# Patient Record
Sex: Female | Born: 1990 | Hispanic: Yes | Marital: Married | Smoking: Never smoker
Health system: Southern US, Community
[De-identification: ages and names within clinical notes are randomized; demographics above are authoritative.]

## PROBLEM LIST (undated history)

## (undated) DIAGNOSIS — D649 Anemia, unspecified: Secondary | ICD-10-CM

## (undated) HISTORY — PX: NO PAST SURGERIES: SHX2092

---

## 2015-11-05 ENCOUNTER — Other Ambulatory Visit: Payer: Self-pay

## 2015-11-07 ENCOUNTER — Encounter (HOSPITAL_COMMUNITY): Payer: Self-pay

## 2016-04-04 ENCOUNTER — Encounter (HOSPITAL_COMMUNITY): Payer: Self-pay | Admitting: Specialist

## 2016-04-04 ENCOUNTER — Other Ambulatory Visit (HOSPITAL_COMMUNITY): Payer: Self-pay | Admitting: Specialist

## 2016-04-04 DIAGNOSIS — R9389 Abnormal findings on diagnostic imaging of other specified body structures: Secondary | ICD-10-CM

## 2016-04-04 DIAGNOSIS — Z3689 Encounter for other specified antenatal screening: Secondary | ICD-10-CM

## 2016-04-04 DIAGNOSIS — Z3A31 31 weeks gestation of pregnancy: Secondary | ICD-10-CM

## 2016-04-15 ENCOUNTER — Ambulatory Visit (HOSPITAL_COMMUNITY)
Admission: RE | Admit: 2016-04-15 | Discharge: 2016-04-15 | Disposition: A | Discharge status: Home / Self Care | Payer: Medicaid Other | Source: Ambulatory Visit | Attending: Specialist | Admitting: Specialist

## 2016-04-15 ENCOUNTER — Encounter (HOSPITAL_COMMUNITY): Payer: Self-pay

## 2016-04-15 ENCOUNTER — Other Ambulatory Visit (HOSPITAL_COMMUNITY): Payer: Self-pay | Admitting: *Deleted

## 2016-04-15 DIAGNOSIS — O0933 Supervision of pregnancy with insufficient antenatal care, third trimester: Secondary | ICD-10-CM | POA: Diagnosis not present

## 2016-04-15 DIAGNOSIS — Z3A31 31 weeks gestation of pregnancy: Secondary | ICD-10-CM | POA: Diagnosis not present

## 2016-04-15 DIAGNOSIS — Z3689 Encounter for other specified antenatal screening: Secondary | ICD-10-CM

## 2016-04-15 DIAGNOSIS — R9389 Abnormal findings on diagnostic imaging of other specified body structures: Secondary | ICD-10-CM

## 2016-04-15 DIAGNOSIS — O34593 Maternal care for other abnormalities of gravid uterus, third trimester: Secondary | ICD-10-CM | POA: Insufficient documentation

## 2016-04-15 DIAGNOSIS — Z820 Family history of epilepsy and other diseases of the nervous system: Secondary | ICD-10-CM

## 2016-04-15 DIAGNOSIS — Z8279 Family history of other congenital malformations, deformations and chromosomal abnormalities: Secondary | ICD-10-CM

## 2016-04-15 HISTORY — DX: Anemia, unspecified: D64.9

## 2016-04-23 ENCOUNTER — Ambulatory Visit (HOSPITAL_COMMUNITY)
Admission: RE | Admit: 2016-04-23 | Discharge: 2016-04-23 | Disposition: A | Discharge status: Home / Self Care | Payer: Medicaid Other | Source: Ambulatory Visit | Attending: Specialist | Admitting: Specialist

## 2016-04-23 ENCOUNTER — Encounter (HOSPITAL_COMMUNITY): Payer: Self-pay

## 2016-04-23 DIAGNOSIS — Z8279 Family history of other congenital malformations, deformations and chromosomal abnormalities: Secondary | ICD-10-CM

## 2016-04-23 DIAGNOSIS — Z820 Family history of epilepsy and other diseases of the nervous system: Secondary | ICD-10-CM | POA: Diagnosis not present

## 2016-12-08 ENCOUNTER — Encounter (HOSPITAL_COMMUNITY): Payer: Self-pay

## 2017-10-20 IMAGING — US US MFM OB DETAIL+14 WK
1 series · 14 of 28 positions shown · non-contrast
Comparison: none

[Series 1: us mfm ob detail+14 wk · 14 of 80 slices shown]
[im 3/80]
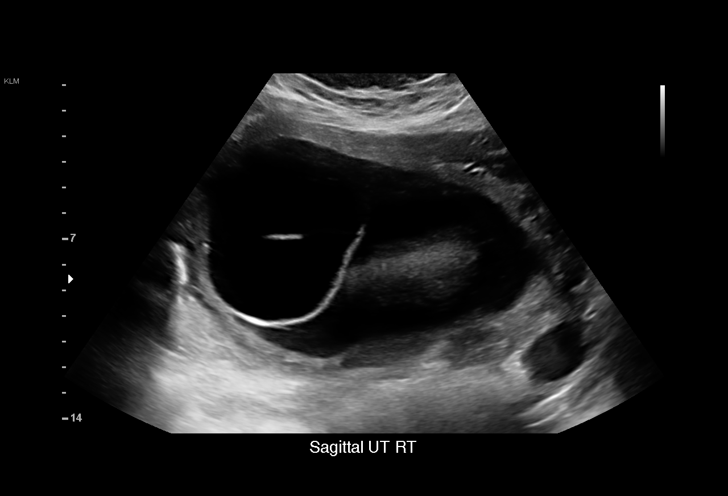
[im 9/80]
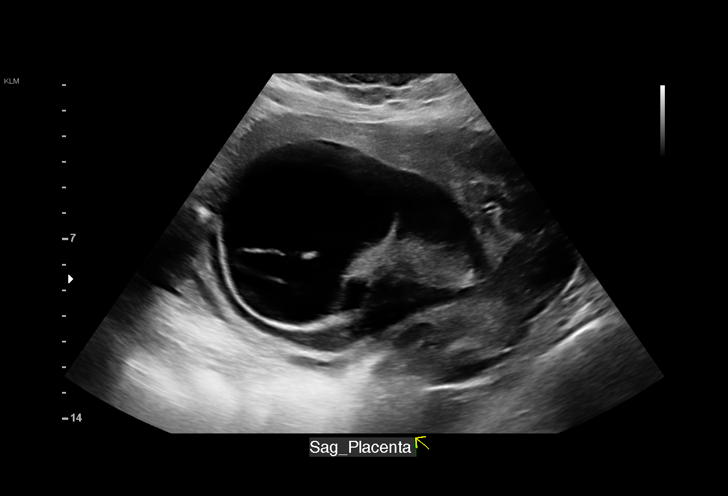
[im 15/80]
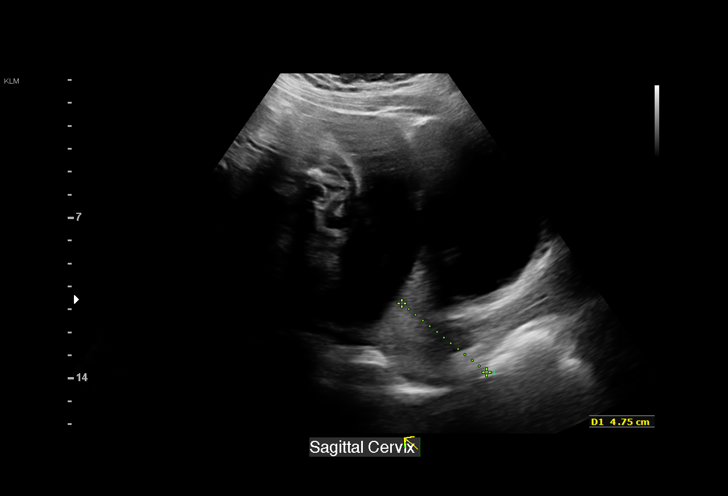
[im 21/80]
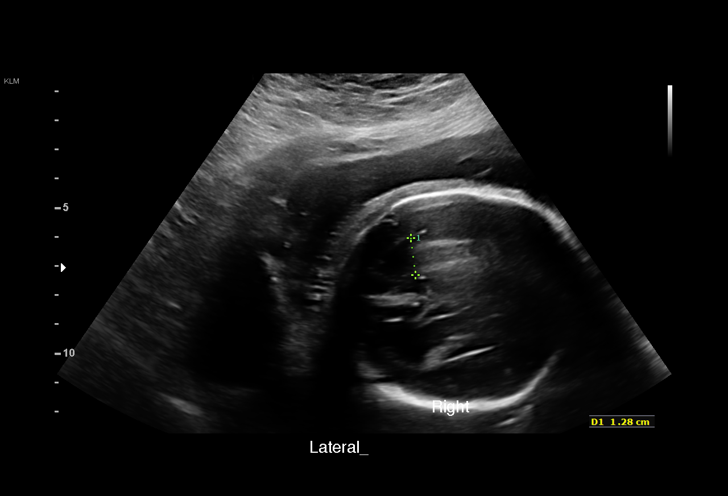
[im 27/80]
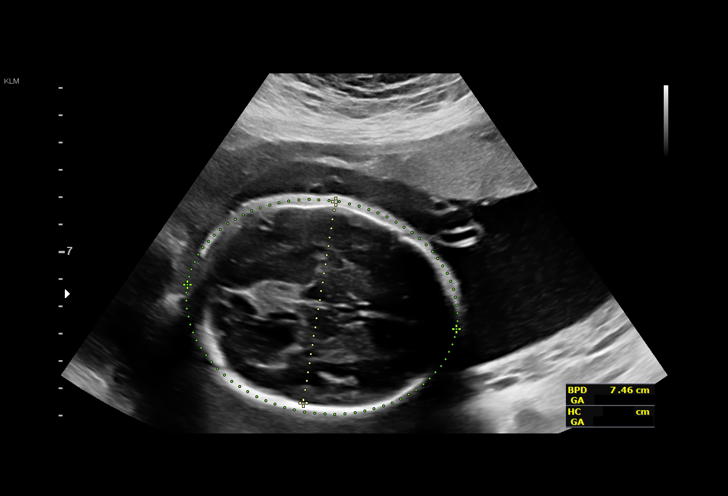
[im 33/80]
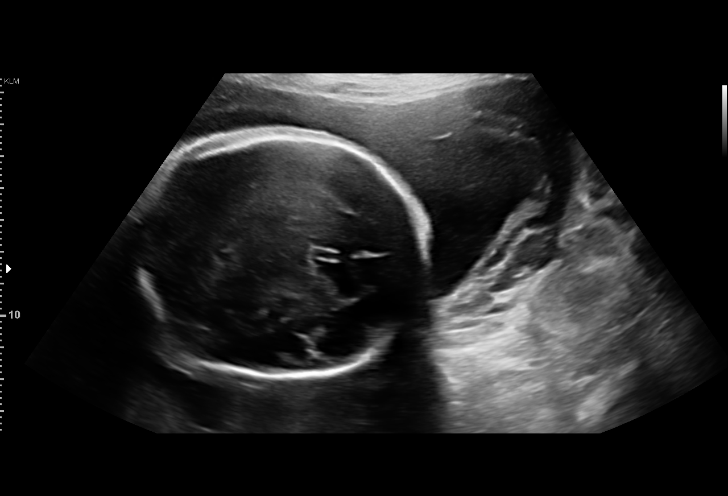
[im 39/80]
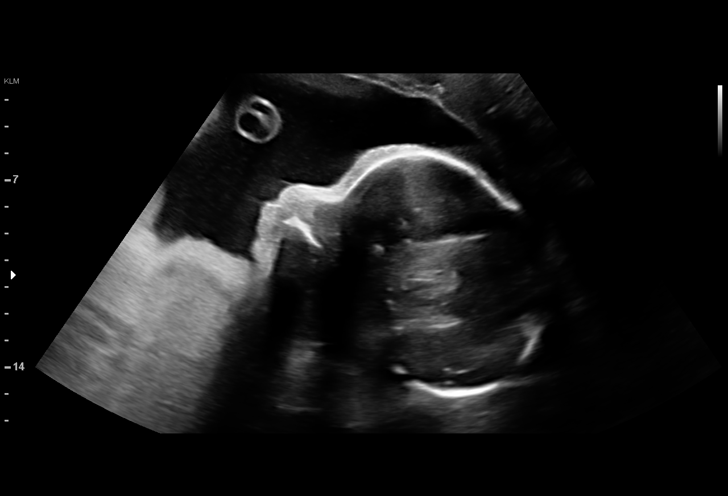
[im 44/80]
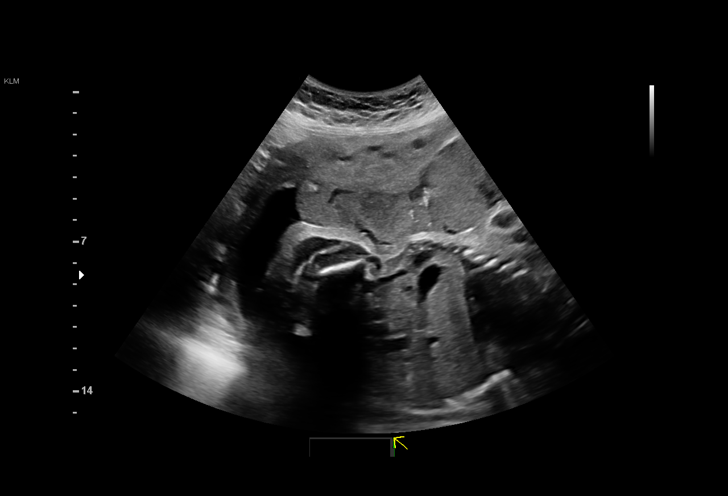
[im 50/80]
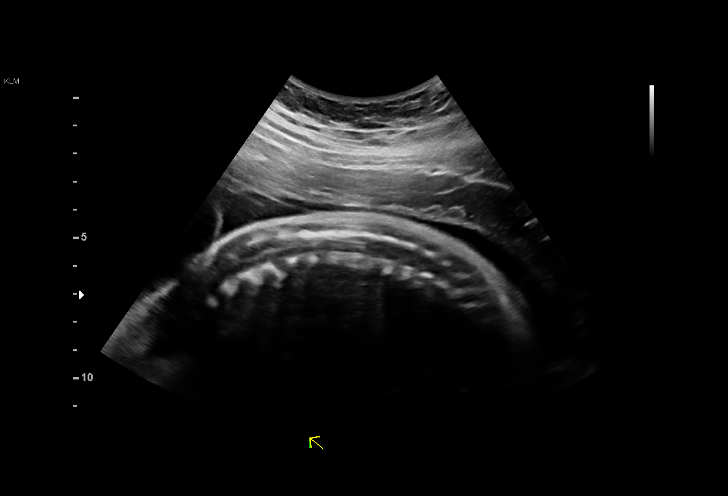
[im 56/80]
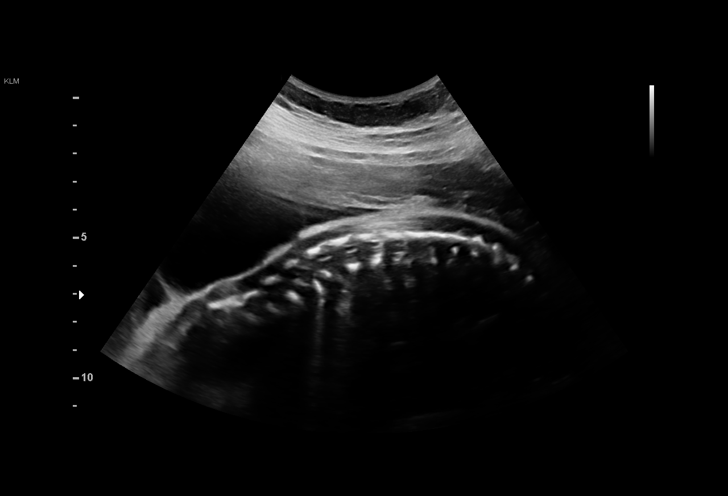
[im 62/80]
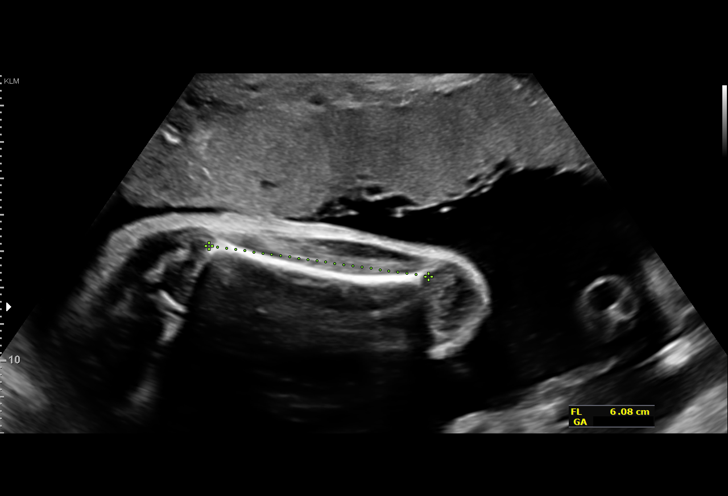
[im 68/80]
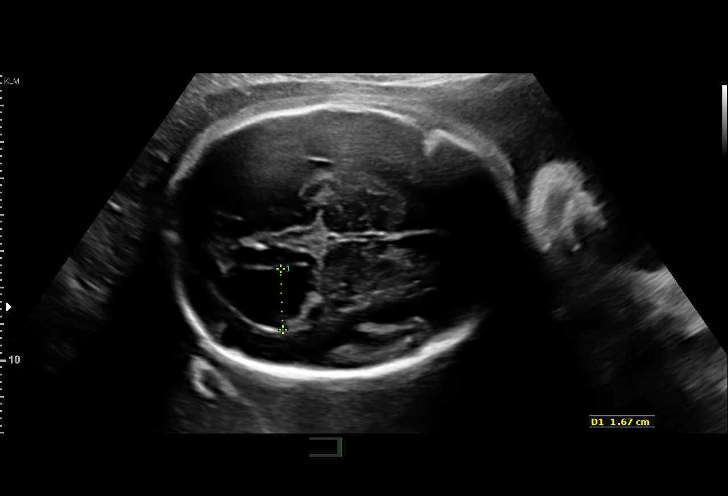
[im 74/80]
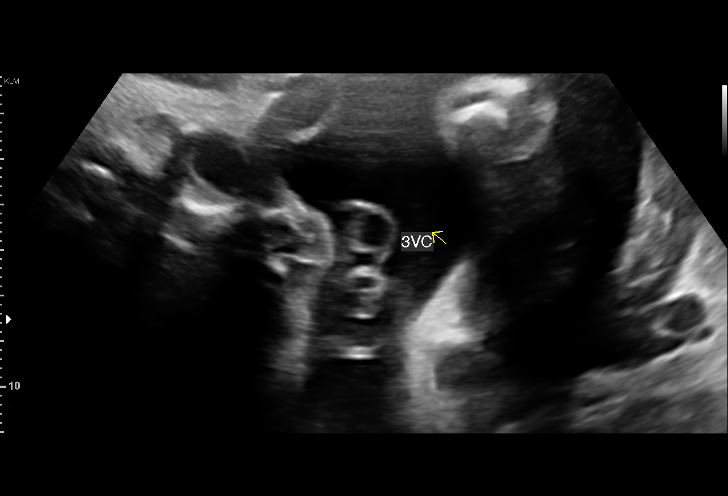
[im 80/80]
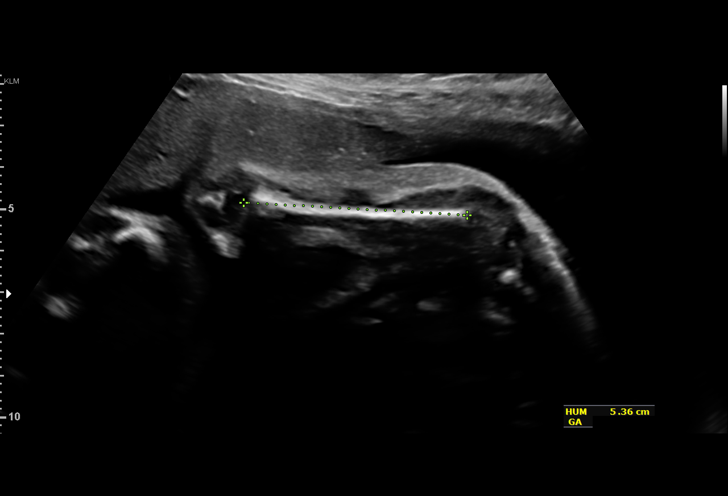

[14 of 28 positions shown; findings below may reference images not displayed]

[HOSPITAL][HOSPITAL]

1  BABARINSA KIO               [id number]      [id number]     [id number]
Indications

31 weeks gestation of pregnancy
Insufficient Prenatal Care
Uterine abnormality during pregnancy
OB History

Gravidity:    3         Term:   1        Prem:   0        SAB:   1
TOP:          0       Ectopic:  0        Living: 1
Fetal Evaluation

Num Of Fetuses:     1
Fetal Heart         154
Rate(bpm):
Cardiac Activity:   Observed
Presentation:       Cephalic
Placenta:           Anterior, above cervical os
P. Cord Insertion:  Visualized

Amniotic Fluid
AFI FV:      Subjectively within normal limits
Biometry

BPD:      74.4  mm     G. Age:  29w 6d          5  %    CI:        72.33   %    70 - 86
FL/HC:      21.4   %    19.1 -
HC:      278.3  mm     G. Age:  30w 3d        < 3  %    HC/AC:      0.91        0.96 -
AC:      304.9  mm     G. Age:  34w 3d       > 97  %    FL/BPD:     80.0   %    71 - 87
FL:       59.5  mm     G. Age:  31w 0d         23  %    FL/AC:      19.5   %    20 - 24
HUM:      53.3  mm     G. Age:  31w 0d         41  %

Est. FW:    1995  gm      4 lb 7 oz     75  %
Gestational Age

LMP:           31w 4d        Date:  09/07/15                 EDD:   06/13/16
U/S Today:     31w 3d                                        EDD:   06/14/16
Best:          31w 4d     Det. By:  LMP  (09/07/15)          EDD:   06/13/16
Anatomy

Face:                  Orbits nl; profile not Abdomen:                Appears normal
well visualized
Heart:                 Appears normal         Abdominal Wall:         Not well visualized
(4CH, axis, and situs
RVOT:                  Not well visualized    Cord Vessels:           Appears normal (3
vessel cord)
LVOT:                  Not well visualized    Kidneys:                Appear normal
Aortic Arch:           Not well visualized    Bladder:                Appears normal
Ductal Arch:           Not well visualized    Upper Extremities:      Visualized
Diaphragm:             Appears normal         Lower Extremities:      Appears normal
Stomach:               Appears normal, left
sided

Other:  Fetus appears to be a female.
Cervix Uterus Adnexa

Cervix
Length:              4  cm.
Normal appearance by transabdominal scan.
Impression

Singleton intrauterine pregnancy at 31+4 weeks with uterine
cyst
Review of the anatomy shows a Large cystic structure
measuring 33x63x23mm with a single bisecting septum. It
appears to arise from the lumbosacral regieon of the fetus.
There is mild to moderate bilateral ventriculomegaly
measuring 15mm on the right and 13mm on the left.
No other malformations are seen. The cavum septum
pellucidum is not seen on todays scan. However, evaluations
should be considered suboptimal secondary to the  late EGA
Amniotic fluid volume is normal
Estimated fetal weight is 2013g which is growth in the 75th
percentile
Recommendations

I am suspicious for a lumbosacral meningomyelocele, but the
intracranial picture is not fully in line with that, in that the
posterior fossa is completely normal. further, it is difficult to
display vertebral body splaying as one would expect in a cyst
that size.
I have gone ahead and ordered a Fetal MRI at [facility] to
be done ASAP. We will see her againnext week and will
hopefully have a definitive diagnosis at that time, and make
plans for future management

## 2019-02-18 ENCOUNTER — Encounter: Payer: Self-pay | Admitting: Family Medicine

## 2021-11-17 ENCOUNTER — Ambulatory Visit (HOSPITAL_COMMUNITY)
Admission: EM | Admit: 2021-11-17 | Discharge: 2021-11-17 | Payer: Medicaid Other | Attending: Physician Assistant | Admitting: Physician Assistant

## 2021-11-17 ENCOUNTER — Other Ambulatory Visit: Payer: Self-pay

## 2021-11-17 ENCOUNTER — Emergency Department (HOSPITAL_COMMUNITY)
Admission: EM | Admit: 2021-11-17 | Discharge: 2021-11-18 | Discharge status: Against Medical Advice | Payer: Medicaid Other | Attending: Emergency Medicine | Admitting: Emergency Medicine

## 2021-11-17 ENCOUNTER — Encounter (HOSPITAL_COMMUNITY): Payer: Self-pay | Admitting: Physician Assistant

## 2021-11-17 ENCOUNTER — Encounter (HOSPITAL_COMMUNITY): Payer: Self-pay | Admitting: Emergency Medicine

## 2021-11-17 DIAGNOSIS — R1013 Epigastric pain: Secondary | ICD-10-CM | POA: Diagnosis not present

## 2021-11-17 DIAGNOSIS — R1011 Right upper quadrant pain: Secondary | ICD-10-CM | POA: Diagnosis present

## 2021-11-17 DIAGNOSIS — R112 Nausea with vomiting, unspecified: Secondary | ICD-10-CM | POA: Insufficient documentation

## 2021-11-17 DIAGNOSIS — Z5321 Procedure and treatment not carried out due to patient leaving prior to being seen by health care provider: Secondary | ICD-10-CM | POA: Insufficient documentation

## 2021-11-17 LAB — COMPREHENSIVE METABOLIC PANEL
ALT: 67 U/L — ABNORMAL HIGH (ref 0–44)
AST: 203 U/L — ABNORMAL HIGH (ref 15–41)
Albumin: 4.1 g/dL (ref 3.5–5.0)
Alkaline Phosphatase: 99 U/L (ref 38–126)
Anion gap: 8 (ref 5–15)
BUN: 12 mg/dL (ref 6–20)
CO2: 25 mmol/L (ref 22–32)
Calcium: 9.3 mg/dL (ref 8.9–10.3)
Chloride: 105 mmol/L (ref 98–111)
Creatinine, Ser: 0.65 mg/dL (ref 0.44–1.00)
GFR, Estimated: >60 mL/min (ref >60)
Glucose, Bld: 127 mg/dL — ABNORMAL HIGH (ref 70–99)
Potassium: 3.5 mmol/L (ref 3.5–5.1)
Sodium: 138 mmol/L (ref 135–145)
Total Bilirubin: 1.7 mg/dL — ABNORMAL HIGH (ref 0.3–1.2)
Total Protein: 7 g/dL (ref 6.5–8.1)

## 2021-11-17 LAB — CBC
HCT: 37.7 % (ref 36.0–46.0)
Hemoglobin: 12.3 g/dL (ref 12.0–15.0)
MCH: 26.4 pg (ref 26.0–34.0)
MCHC: 32.6 g/dL (ref 30.0–36.0)
MCV: 80.9 fL (ref 80.0–100.0)
Platelets: 312 10*3/uL (ref 150–400)
RBC: 4.66 MIL/uL (ref 3.87–5.11)
RDW: 14.7 % (ref 11.5–15.5)
WBC: 12.5 10*3/uL — ABNORMAL HIGH (ref 4.0–10.5)
nRBC: 0 % (ref 0.0–0.2)

## 2021-11-17 LAB — I-STAT BETA HCG BLOOD, ED (MC, WL, AP ONLY): I-stat hCG, quantitative: <5 m[IU]/mL (ref <5)

## 2021-11-17 LAB — POCT URINALYSIS DIPSTICK, ED / UC
Glucose, UA: NEGATIVE mg/dL
Leukocytes,Ua: NEGATIVE
Nitrite: NEGATIVE
Protein, ur: 30 mg/dL — AB
Specific Gravity, Urine: 1.025 (ref 1.005–1.030)
Urobilinogen, UA: 1 mg/dL (ref 0.0–1.0)
pH: 6.5 (ref 5.0–8.0)

## 2021-11-17 LAB — LIPASE, BLOOD: Lipase: 35 U/L (ref 11–51)

## 2021-11-17 LAB — POC URINE PREG, ED: Preg Test, Ur: NEGATIVE

## 2021-11-17 MED ORDER — ONDANSETRON 4 MG PO TBDP: 4.0000 mg | ORAL_TABLET | Freq: Once | ORAL | Status: DC | PRN

## 2021-11-17 MED ORDER — ONDANSETRON 4 MG PO TBDP: 8.0000 mg | ORAL_TABLET | Freq: Once | ORAL | Status: DC

## 2021-11-17 MED ORDER — HYDROCODONE-ACETAMINOPHEN 5-325 MG PO TABS: 1.0000 | ORAL_TABLET | Freq: Once | ORAL | Status: DC

## 2021-11-17 NOTE — ED Provider Notes (Signed)
[facility]    CSN: [id number] Arrival date & time: 11/17/21  1259      History   Chief Complaint Chief Complaint  Patient presents with   Abdominal Pain   Back Pain   Emesis    HPI Danielle Orr is a 31 y.o. female.   Patient here today for evaluation of epigastric pain and vomiting that started last night about 11 PM.  Pain radiates to her center back.  She reports that since this time she has had 2 episodes of vomiting.  She notes she is constipated and does report a very small bowel movement earlier today.  She does not report any blood in her stool or dark tarry stools.  She states she has had similar epigastric pain in the past but typically it was very short-lived and would resolve without any treatment and would clear for significant amount of time.  She notes that since 11 PM her pain has remained constant although does wax and wane between a 3 out of 10 pain level and a 10 out of 10 pain level.  She states when her pain is at its worst she does feel diaphoretic and as if she will pass out.  The history is provided by the patient.  Abdominal Pain Associated symptoms: constipation, nausea and vomiting   Associated symptoms: no chills, no diarrhea, no fever and no shortness of breath   Back Pain Associated symptoms: abdominal pain   Associated symptoms: no fever   Emesis Associated symptoms: abdominal pain   Associated symptoms: no chills, no diarrhea and no fever     Past Medical History:  Diagnosis Date   Anemia     There are no problems to display for this patient.   Past Surgical History:  Procedure Laterality Date   NO PAST SURGERIES      OB History     Gravida  3   Para  1   Term  1   Preterm      AB  1   Living  1      SAB  1   IAB      Ectopic      Multiple      Live Births               Home Medications    Prior to Admission medications   Medication Sig Start Date End Date Taking? Authorizing Provider  IRON  PO Take by mouth.    [provider]  Prenatal Vit w/Fe-Methylfol-FA (PNV PO) Take by mouth.    [provider]    Family History History reviewed. No pertinent family history.  Social History Social History   Tobacco Use   Smoking status: Never   Smokeless tobacco: Never  Substance Use Topics   Alcohol use: No   Drug use: No     Allergies   Patient has no known allergies.   Review of Systems Review of Systems  Constitutional:  Negative for chills and fever.  Eyes:  Negative for discharge and redness.  Respiratory:  Negative for shortness of breath.   Gastrointestinal:  Positive for abdominal pain, constipation, nausea and vomiting. Negative for diarrhea.  Musculoskeletal:  Positive for back pain.     Physical Exam Triage Vital Signs ED Triage Vitals  Enc Vitals Group     BP      Pulse      Resp      Temp      Temp  src      SpO2      Weight      Height      Head Circumference      Peak Flow      Pain Score      Pain Loc      Pain Edu?      Excl. in [place]?    No data found.  Updated Vital Signs BP 103/61   Pulse (!) 56   Temp 98.4 F (36.9 C)   Resp 20   SpO2 100%      Physical Exam Vitals and nursing note reviewed.  Constitutional:      General: She is in acute distress (appears uncomfortable).     Appearance: Normal appearance.  HENT:     Head: Normocephalic and atraumatic.  Eyes:     Conjunctiva/sclera: Conjunctivae normal.  Cardiovascular:     Rate and Rhythm: Normal rate.  Pulmonary:     Effort: Pulmonary effort is normal.  Neurological:     Mental Status: She is alert.  Psychiatric:        Mood and Affect: Mood normal.        Behavior: Behavior normal.        Thought Content: Thought content normal.      UC Treatments / Results  Labs (all labs ordered are listed, but only abnormal results are displayed) Labs Reviewed  POCT URINALYSIS DIPSTICK, ED / UC  POC URINE PREG, ED    EKG   Radiology No results  found.  Procedures Procedures (including critical care time)  Medications Ordered in UC Medications - No data to display  Initial Impression / Assessment and Plan / UC Course  I have reviewed the triage vital signs and the nursing notes.  Pertinent labs & imaging results that were available during my care of the patient were reviewed by me and considered in my medical decision making (see chart for details).    Given presentation recommended further evaluation in the emergency department for stat labs and likely CT scan.  Differential could include partial bowel obstruction versus pancreatitis versus gastritis versus other etiology.  Patient is agreeable to be evaluated in the emergency department and will have husband transport her via POV from this office.  Final Clinical Impressions(s) / UC Diagnoses   Final diagnoses:  Abdominal pain, epigastric   Discharge Instructions   None    ED Prescriptions   None    PDMP not reviewed this encounter.   Francene Finders, PA-C 11/17/21 [phone]

## 2021-11-17 NOTE — ED Triage Notes (Signed)
Patient here for evaluation of RUQ abdominal pain that started at 2300 last night. Patient also reports vomiting, denies diarrhea. Patient is alert, oriented, and in no apparent distress at this time.

## 2021-11-17 NOTE — ED Provider Triage Note (Signed)
Emergency Medicine Provider Triage Evaluation Note  Danielle Orr , a 31 y.o. female  was evaluated in triage.  Pt complains of abdominal pain with associated nausea and vomiting.  Denies fever.  Pain is worse in the right upper quadrant.  Review of Systems  Positive: As above Negative: As above  Physical Exam  BP (!) 112/59 (BP Location: Left Arm)   Pulse (!) 59   Temp 98.3 F (36.8 C)   Resp 16   SpO2 100%  Gen:   Awake, no distress   Resp:  Normal effort  MSK:   Moves extremities without difficulty  Other:  Generalized abdominal tenderness present.  Without flank tenderness  Medical Decision Making  Medically screening exam initiated at 4:04 PM.  Appropriate orders placed.  Danielle Orr was informed that the remainder of the evaluation will be completed by another provider, this initial triage assessment does not replace that evaluation, and the importance of remaining in the ED until their evaluation is complete.     Evlyn Courier, PA-C 11/17/21 1606

## 2021-11-17 NOTE — ED Triage Notes (Signed)
Pt report abd PAIN AND VOMITING SINCE LAST NIGHT. PT HAS VOMITED 2 TIMES since last night. Pt's lips are pale.

## 2021-11-18 NOTE — ED Notes (Signed)
PT called X2 with no response

## 2021-11-18 NOTE — ED Notes (Signed)
Pt called 2x no answer 

## 2022-09-19 ENCOUNTER — Inpatient Hospital Stay (HOSPITAL_COMMUNITY): Payer: Medicaid Other

## 2022-09-19 ENCOUNTER — Inpatient Hospital Stay (HOSPITAL_COMMUNITY)
Admission: EM | Admit: 2022-09-19 | Discharge: 2022-09-19 | Disposition: A | Discharge status: Home / Self Care | Payer: Medicaid Other | Attending: Obstetrics and Gynecology | Admitting: Obstetrics and Gynecology

## 2022-09-19 ENCOUNTER — Other Ambulatory Visit: Payer: Self-pay

## 2022-09-19 ENCOUNTER — Encounter (HOSPITAL_COMMUNITY): Payer: Self-pay

## 2022-09-19 DIAGNOSIS — Z3481 Encounter for supervision of other normal pregnancy, first trimester: Secondary | ICD-10-CM | POA: Insufficient documentation

## 2022-09-19 DIAGNOSIS — Z3A09 9 weeks gestation of pregnancy: Secondary | ICD-10-CM | POA: Diagnosis not present

## 2022-09-19 DIAGNOSIS — Z9851 Tubal ligation status: Secondary | ICD-10-CM

## 2022-09-19 DIAGNOSIS — R1031 Right lower quadrant pain: Secondary | ICD-10-CM | POA: Diagnosis present

## 2022-09-19 DIAGNOSIS — Z3491 Encounter for supervision of normal pregnancy, unspecified, first trimester: Secondary | ICD-10-CM

## 2022-09-19 LAB — COMPREHENSIVE METABOLIC PANEL
ALT: 10 U/L (ref 0–44)
AST: 13 U/L — ABNORMAL LOW (ref 15–41)
Albumin: 3.5 g/dL (ref 3.5–5.0)
Alkaline Phosphatase: 34 U/L — ABNORMAL LOW (ref 38–126)
Anion gap: 11 (ref 5–15)
BUN: 7 mg/dL (ref 6–20)
CO2: 20 mmol/L — ABNORMAL LOW (ref 22–32)
Calcium: 8.9 mg/dL (ref 8.9–10.3)
Chloride: 102 mmol/L (ref 98–111)
Creatinine, Ser: 0.49 mg/dL (ref 0.44–1.00)
GFR, Estimated: >60 mL/min (ref >60)
Glucose, Bld: 87 mg/dL (ref 70–99)
Potassium: 3.7 mmol/L (ref 3.5–5.1)
Sodium: 133 mmol/L — ABNORMAL LOW (ref 135–145)
Total Bilirubin: 0.4 mg/dL (ref 0.3–1.2)
Total Protein: 6.7 g/dL (ref 6.5–8.1)

## 2022-09-19 LAB — WET PREP, GENITAL
Clue Cells Wet Prep HPF POC: NONE SEEN
Sperm: NONE SEEN
Trich, Wet Prep: NONE SEEN
WBC, Wet Prep HPF POC: <10 (ref <10)
Yeast Wet Prep HPF POC: NONE SEEN

## 2022-09-19 LAB — URINALYSIS, ROUTINE W REFLEX MICROSCOPIC
Bilirubin Urine: NEGATIVE
Glucose, UA: NEGATIVE mg/dL
Hgb urine dipstick: NEGATIVE
Ketones, ur: 20 mg/dL — AB
Leukocytes,Ua: NEGATIVE
Nitrite: NEGATIVE
Protein, ur: NEGATIVE mg/dL
Specific Gravity, Urine: 1.016 (ref 1.005–1.030)
pH: 5 (ref 5.0–8.0)

## 2022-09-19 LAB — CBC
HCT: 34.5 % — ABNORMAL LOW (ref 36.0–46.0)
Hemoglobin: 10.7 g/dL — ABNORMAL LOW (ref 12.0–15.0)
MCH: 24.8 pg — ABNORMAL LOW (ref 26.0–34.0)
MCHC: 31 g/dL (ref 30.0–36.0)
MCV: 79.9 fL — ABNORMAL LOW (ref 80.0–100.0)
Platelets: 307 10*3/uL (ref 150–400)
RBC: 4.32 MIL/uL (ref 3.87–5.11)
RDW: 16.1 % — ABNORMAL HIGH (ref 11.5–15.5)
WBC: 7.1 10*3/uL (ref 4.0–10.5)
nRBC: 0 % (ref 0.0–0.2)

## 2022-09-19 LAB — HCG, QUANTITATIVE, PREGNANCY: hCG, Beta Chain, Quant, S: 211330 m[IU]/mL — ABNORMAL HIGH (ref <5)

## 2022-09-19 LAB — HCG, SERUM, QUALITATIVE: Preg, Serum: POSITIVE — AB

## 2022-09-19 MED ORDER — OB COMPLETE PETITE 35-5-1-200 MG PO CAPS: 1.0000 | ORAL_CAPSULE | Freq: Every day | ORAL | 10 refills | Status: AC

## 2022-09-19 NOTE — ED Triage Notes (Signed)
Pt came in via POV d/t her PCP suspecting that she may have an ectopic pregnancy d/t her pregnancy s/s after having a tubal ligation done 17 months ago along with sharp Rt lower abd pain. A/Ox4, rates her pain 5/10 while in triage.

## 2022-09-19 NOTE — MAU Note (Addendum)
Pt says she went to Dr appointment at 11am Told you were preg- told to come to [facility] ER. Went to [facility] ER- 130pm- drew labs Told her to come here . No pain now - but has had right lower abd pain - 2 weeks ago and yesterday ( 5/10)  Pt says had BTL - in [place] in March 2023

## 2022-09-19 NOTE — MAU Provider Note (Signed)
Chief Complaint: Abdominal Pain   Event Date/Time   First Provider Initiated Contact with Patient 09/19/22 2043      SUBJECTIVE HPI: Danielle Orr is a 32 y.o. [id number] at [redacted]w[redacted]d by {Ob dating:14516} who presents to maternity admissions reporting ***. She denies vaginal bleeding, vaginal itching/burning, urinary symptoms, h/a, dizziness, n/v, or fever/chills.     Location: *** Quality: *** Severity: ***/10 on pain scale Duration: *** Timing: *** Modifying factors: *** Associated signs and symptoms: ***  HPI  Past Medical History:  Diagnosis Date   Anemia    Past Surgical History:  Procedure Laterality Date   NO PAST SURGERIES     Social History   Socioeconomic History   Marital status: Married    Spouse name: Not on file   Number of children: Not on file   Years of education: Not on file   Highest education level: Not on file  Occupational History   Not on file  Tobacco Use   Smoking status: Never   Smokeless tobacco: Never  Substance and Sexual Activity   Alcohol use: No   Drug use: No   Sexual activity: Not Currently    Birth control/protection: None  Other Topics Concern   Not on file  Social History Narrative   Not on file   Social Determinants of Health   Financial Resource Strain: Not on file  Food Insecurity: No Food Insecurity (03/14/2021)   [facility] from [facility] visits prior to 04/05/2022., [facility] prior to 04/05/2022., [facility]   Hunger Vital Sign    Worried About Running Out of Food in the Last Year: Never true    Ran Out of Food in the Last Year: Never true  Transportation Needs: Not on file  Physical Activity: Not on file  Stress: Not on file  Social Connections: Not on file  Intimate Partner Violence: Not At Risk (03/14/2021)   [facility] prior to 04/05/2022., [facility] visits prior to 04/05/2022.   Humiliation, Afraid,  Rape, and Kick questionnaire    Fear of Current or Ex-Partner: No    Emotionally Abused: No    Physically Abused: No    Sexually Abused: No   No current facility-administered medications on file prior to encounter.   Current Outpatient Medications on File Prior to Encounter  Medication Sig Dispense Refill   IRON PO Take by mouth.     Prenatal Vit w/Fe-Methylfol-FA (PNV PO) Take by mouth.     No Known Allergies  ROS:  Review of Systems   I have reviewed patient's Past Medical Hx, Surgical Hx, Family Hx, Social Hx, medications and allergies.   Physical Exam  Patient Vitals for the past 24 hrs:  BP Temp Temp src Pulse Resp SpO2 Height Weight  09/19/22 2035 (!) 107/52 98.3 F (36.8 C) Oral 60 16 -- 5\' 4"  (1.626 m) 71.9 kg  09/19/22 1924 119/68 98.9 F (37.2 C) -- 61 18 100 % -- --  09/19/22 1412 -- -- -- -- -- -- 5\' 4"  (1.626 m) 72.1 kg  09/19/22 1352 (!) 114/58 98.2 F (36.8 C) Oral 69 18 100 % -- --   Constitutional: Well-developed, well-nourished female in no acute distress.  Cardiovascular: normal rate Respiratory: normal effort GI: Abd soft, non-tender. Pos BS x 4 MS: Extremities nontender, no edema, normal ROM Neurologic: Alert and oriented x 4.  GU: Neg CVAT.  PELVIC EXAM: Cervix pink, visually  closed, without lesion, scant white creamy discharge, vaginal walls and external genitalia normal Bimanual exam: Cervix 0/long/high, firm, anterior, neg CMT, uterus nontender, nonenlarged, adnexa without tenderness, enlargement, or mass  FHT *** by doppler  LAB RESULTS Results for orders placed or performed during the hospital encounter of 09/19/22 (from the past 24 hour(s))  Comprehensive metabolic panel     Status: Abnormal   Collection Time: 09/19/22  2:25 PM  Result Value Ref Range   Sodium 133 (L) 135 - 145 mmol/L   Potassium 3.7 3.5 - 5.1 mmol/L   Chloride 102 98 - 111 mmol/L   CO2 20 (L) 22 - 32 mmol/L   Glucose, Bld 87 70 - 99 mg/dL   BUN 7 6 - 20 mg/dL    Creatinine, Ser 1.61 0.44 - 1.00 mg/dL   Calcium 8.9 8.9 - 09.6 mg/dL   Total Protein 6.7 6.5 - 8.1 g/dL   Albumin 3.5 3.5 - 5.0 g/dL   AST 13 (L) 15 - 41 U/L   ALT 10 0 - 44 U/L   Alkaline Phosphatase 34 (L) 38 - 126 U/L   Total Bilirubin 0.4 0.3 - 1.2 mg/dL   GFR, Estimated >04 >54 mL/min   Anion gap 11 5 - 15  CBC     Status: Abnormal   Collection Time: 09/19/22  2:25 PM  Result Value Ref Range   WBC 7.1 4.0 - 10.5 K/uL   RBC 4.32 3.87 - 5.11 MIL/uL   Hemoglobin 10.7 (L) 12.0 - 15.0 g/dL   HCT 09.8 (L) 11.9 - 14.7 %   MCV 79.9 (L) 80.0 - 100.0 fL   MCH 24.8 (L) 26.0 - 34.0 pg   MCHC 31.0 30.0 - 36.0 g/dL   RDW 82.9 (H) 56.2 - 13.0 %   Platelets 307 150 - 400 K/uL   nRBC 0.0 0.0 - 0.2 %  hCG, serum, qualitative     Status: Abnormal   Collection Time: 09/19/22  2:25 PM  Result Value Ref Range   Preg, Serum POSITIVE (A) NEGATIVE  hCG, quantitative, pregnancy     Status: Abnormal   Collection Time: 09/19/22  2:33 PM  Result Value Ref Range   hCG, Beta Chain, Quant, S 211,330 (H) <5 mIU/mL       IMAGING No results found.  MAU Management/MDM: Orders Placed This Encounter  Procedures   US OB LESS THAN 14 WEEKS WITH OB TRANSVAGINAL   Comprehensive metabolic panel   CBC   hCG, serum, qualitative   hCG, quantitative, pregnancy    No orders of the defined types were placed in this encounter.   Consult ***.  Treatments in MAU included ***. Pt discharged with strict *** precautions.  ASSESSMENT No diagnosis found.  PLAN Discharge home Allergies as of 09/19/2022   No Known Allergies   Med Rec must be completed prior to using this [facility]***        Sharen Counter Certified Nurse-Midwife 09/19/2022  8:43 PM

## 2022-09-19 NOTE — ED Provider Triage Note (Signed)
Emergency Medicine Provider Triage Evaluation Note  Danielle Orr , a 32 y.o. female  was evaluated in triage.  Pt complains of RLQ abdominal pain.  Review of Systems  Positive:  Negative:   Physical Exam  BP 119/68 (BP Location: Right Arm)   Pulse 61   Temp 98.9 F (37.2 C)   Resp 18   Ht 5\' 4"  (1.626 m)   Wt 72.1 kg   SpO2 100%   BMI 27.29 kg/m  Gen:   Awake, no distress   Resp:  Normal effort  MSK:   Moves extremities without difficulty  Other:    Medical Decision Making  Medically screening exam initiated at 7:50 PM.  Appropriate orders placed.  Danielle Orr was informed that the remainder of the evaluation will be completed by another provider, this initial triage assessment does not replace that evaluation, and the importance of remaining in the ED until their evaluation is complete.  [id number]. S/p tubal ligation 1.5 years ago. Complaining of intermittent RLQ abdominal pain x2 months that has been slowly progressing in severity. Endorses white/green vaginal discharge.  Denies fever, chest pain, dyspnea, cough, nausea, vomiting, diarrhea, dysuria, hematuria, hematochezia. Denies vaginal bleeding.  Talked with MAU APP Victorino Dike who agrees to see patient.    Dorthy Cooler, [place] 09/19/22 1958

## 2022-09-22 LAB — GC/CHLAMYDIA PROBE AMP (~~LOC~~) NOT AT ARMC
Chlamydia: NEGATIVE
Comment: NEGATIVE
Comment: NORMAL
Neisseria Gonorrhea: NEGATIVE
